# Patient Record
Sex: Male | Born: 1995 | Race: Black or African American | Hispanic: No | Smoking: Never smoker
Health system: Southern US, Community
[De-identification: ages and names within clinical notes are randomized; demographics above are authoritative.]

---
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
## 2023-06-08 ENCOUNTER — Encounter: Payer: Self-pay | Admitting: Family Medicine

## 2023-06-08 ENCOUNTER — Ambulatory Visit: Payer: Self-pay | Admitting: Family Medicine

## 2023-06-08 DIAGNOSIS — Z113 Encounter for screening for infections with a predominantly sexual mode of transmission: Secondary | ICD-10-CM

## 2023-06-08 LAB — HM HIV SCREENING LAB: HM HIV Screening: NEGATIVE

## 2023-06-08 NOTE — Progress Notes (Signed)
[facility] Department STI clinic/screening visit  Subjective:  Jason Andrews is a 27 y.o. male being seen today for an STI screening visit. The patient reports they do not have symptoms.    Patient has the following medical conditions:  There are no problems to display for this patient.    Chief Complaint  Patient presents with   SEXUALLY TRANSMITTED DISEASE    HPI  Patient reports to clinic for STI testing. No symptoms  Last HIV test per patient/review of record was No results found for: "HMHIVSCREEN" No results found for: "HIV"  Does the patient or their partner desires a pregnancy in the next year? No  Screening for MPX risk: Does the patient have an unexplained rash? No Is the patient MSM? No Does the patient endorse multiple sex partners or anonymous sex partners? No Did the patient have close or sexual contact with a person diagnosed with MPX? No Has the patient traveled outside the [place] where MPX is endemic? No Is there a high clinical suspicion for MPX-- evidenced by one of the following No  -Unlikely to be chickenpox  -Lymphadenopathy  -Rash that present in same phase of evolution on any given body part   See flowsheet for further details and programmatic requirements.    There is no immunization history on file for this patient.   The following portions of the patient's history were reviewed and updated as appropriate: allergies, current medications, past medical history, past social history, past surgical history and problem list.  Objective:  There were no vitals filed for this visit.  Physical Exam Vitals and nursing note reviewed.  Constitutional:      Appearance: Normal appearance.  HENT:     Head: Normocephalic and atraumatic.     Mouth/Throat:     Mouth: Mucous membranes are moist.     Pharynx: No oropharyngeal exudate or posterior oropharyngeal erythema.  Eyes:     General:        Right eye: No discharge.        Left eye: No  discharge.     Conjunctiva/sclera:     Right eye: Right conjunctiva is not injected. No exudate.    Left eye: Left conjunctiva is not injected. No exudate. Pulmonary:     Effort: Pulmonary effort is normal.  Abdominal:     General: Abdomen is flat.     Palpations: Abdomen is soft. There is no hepatomegaly or mass.     Tenderness: There is no abdominal tenderness. There is no rebound.  Genitourinary:    Comments: Declined genital exam- asymptomatic Lymphadenopathy:     Cervical: No cervical adenopathy.     Upper Body:     Right upper body: No supraclavicular or axillary adenopathy.     Left upper body: No supraclavicular or axillary adenopathy.  Skin:    General: Skin is warm and dry.  Neurological:     Mental Status: He is alert and oriented to person, place, and time.       Assessment and Plan:  Jason Andrews is a 27 y.o. male presenting to the [facility] Department for STI screening  1. Screening for venereal disease  - HIV [facility] - Syphilis Serology, [facility] - Chlamydia/GC NAA, Confirmation   Patient does not have STI symptoms Patient accepted all screenings including  urine GC/Chlamydia, and blood work for HIV/Syphilis. Patient meets criteria for HepB screening? No. Ordered? not applicable Patient meets criteria for HepC screening? No. Ordered? not  applicable Recommended condom use with all sex Discussed importance of condom use for STI prevent  Treat positive test results per standing order. Discussed time line for State Lab results and that patient will be called with positive results and encouraged patient to call if he had not heard in 2 weeks Recommended repeat testing in 3 months with positive results. Recommended returning for continued or worsening symptoms.   Return if symptoms worsen or fail to improve, for STI screening.  No future appointments. Total time spent 15 minutes Lenice Llamas, [place]

## 2023-06-10 LAB — CHLAMYDIA/GC NAA, CONFIRMATION
Chlamydia trachomatis, NAA: NEGATIVE
Neisseria gonorrhoeae, NAA: NEGATIVE

## 2024-04-25 ENCOUNTER — Encounter: Payer: Self-pay | Admitting: Nurse Practitioner

## 2024-04-25 ENCOUNTER — Ambulatory Visit: Payer: Self-pay | Admitting: Nurse Practitioner

## 2024-04-25 DIAGNOSIS — Z113 Encounter for screening for infections with a predominantly sexual mode of transmission: Secondary | ICD-10-CM

## 2024-04-25 LAB — HM HEPATITIS C SCREENING LAB: HM Hepatitis Screen: NEGATIVE

## 2024-04-25 LAB — HM HIV SCREENING LAB: HM HIV Screening: NEGATIVE

## 2024-04-25 NOTE — Progress Notes (Signed)
 [facility] Department STI clinic [address], [address] [place] [address] Main phone: [phone]  STI screening visit  Subjective:  Jason Andrews is a 28 y.o. male being seen today for an STI screening visit. The patient reports they do not have symptoms.    Patient has the following medical conditions:  There are no active problems to display for this patient.  Chief Complaint  Patient presents with   SEXUALLY TRANSMITTED DISEASE    HPI Patient is a pleasant 28 y.o. male who presents to the office today requesting asymptomatic STI testing.   He reports 1 male/male partner almost 3 months ago. He reports that he practices penile/vaginal penetrative sex. Patient reports using condoms sometimes. Patient indicates no STI history. He reports last sex was 01/31/2024.   See flowsheet for further details and programmatic requirements  Hyperlink available at the top of the signed note in blue.  Flow sheet content below:  Pregnancy Intention Screening Does the patient want to become pregnant in the next year?: No Does the patient's partner want to become pregnant in the next year?: No Would the patient like to discuss contraceptive options today?: N/A All Patients Anyone smoke around pt and/or pt's children?: No Anyone smoke inside pt's house?: No Anyone smoke inside car?: No Anyone smoke inside the workplace?: No Reason For STD Screen STD Screening: Is asymptomatic Have you ever had an STD?: No History of Antibiotic use in the past 2 weeks?: No STD Symptoms Denies all: Yes Risk Factors for Hep B Household, sexual, or needle sharing contact of a person infected with Hep B: No Sexual contact with a person who uses drugs not as prescribed?: No Currently or Ever used drugs not as prescribed: No HIV Positive: No PRep Patient: No Men who have sex with men: No Have Hepatitis C: No History of Incarceration: No History of Homeslessness?: No Anal sex  following anal drug use?: No Risk Factors for Hep C Currently using drugs not as prescribed: No Sexual partner(s) currently using drugs as not prescribed: No History of drug use: Yes HIV Positive: No People with a history of incarceration: No People born between the years of 78 and 78: No Hepatitis Counseling Hep C Counseling: Counseled patient about increased risk of Hep C and recommendation for testing, Patient accepts testing for Hep C today Abuse History Has patient ever been abused physically?: No Has patient ever been abused sexually?: No Does patient feel they have a problem with Anxiety?: No Does patient feel they have a problem with Depression?: No Referral to Behavioral Health: N/A Counseling Patient counseled to use condoms with all sex: Condoms declined RTC in 2-3 weeks for test results: Yes Clinic will call if test results abnormal before test result appt.: Yes Test results given to patient Patient counseled to use condoms with all sex: Condoms declined Contraception Wrap Up Current Method: Male Condom End Method: Male Condom Contraception Counseling Provided: Yes How was the end contraceptive method provided?: N/A (Declined condoms)  Screening for MPX risk: Does the patient have an unexplained rash? No Is the patient MSM? No Does the patient endorse multiple sex partners or anonymous sex partners? No Did the patient have close or sexual contact with a person diagnosed with MPX? No Has the patient traveled outside the [place]  where MPX is endemic? No Is there a high clinical suspicion for MPX-- evidenced by one of the following No  -Unlikely to be chickenpox  -Lymphadenopathy  -Rash that present in same phase  of evolution on any given body part  STI screening history: Last HIV test per patient/review of record was  Lab Results  Component Value Date   HMHIVSCREEN Negative - Validated 06/08/2023    Last HEPC test per patient/review of record was No results  found for: "HMHEPCSCREEN" No components found for: "HEPC"   Last HEPB test per patient/review of record was No components found for: "HMHEPBSCREEN"   Fertility: Does the patient or their partner desires a pregnancy in the next year? No   There is no immunization history on file for this patient.  The following portions of the patient's history were reviewed and updated as appropriate: allergies, current medications, past medical history, past social history, past surgical history and problem list.  Objective:  There were no vitals filed for this visit.  Physical Exam Nursing note reviewed.  Constitutional:      Appearance: Normal appearance.  HENT:     Head: Normocephalic.     Salivary Glands: Right salivary gland is not diffusely enlarged or tender. Left salivary gland is not diffusely enlarged or tender.     Mouth/Throat:     Lips: Pink. No lesions.     Mouth: Mucous membranes are moist. No oral lesions.     Tongue: No lesions. Tongue does not deviate from midline.     Pharynx: Oropharynx is clear. Uvula midline. No oropharyngeal exudate or posterior oropharyngeal erythema.     Tonsils: No tonsillar exudate.  Eyes:     General:        Right eye: No discharge.        Left eye: No discharge.     Conjunctiva/sclera:     Right eye: Right conjunctiva is not injected. No exudate.    Left eye: Left conjunctiva is not injected. No exudate. Pulmonary:     Effort: Pulmonary effort is normal.  Genitourinary:    Comments: Patient asymptomatic and declines genital exam.  Lymphadenopathy:     Head:     Right side of head: No submental, submandibular, tonsillar, preauricular or posterior auricular adenopathy.     Left side of head: No submental, submandibular, tonsillar, preauricular or posterior auricular adenopathy.     Cervical: No cervical adenopathy.     Right cervical: No superficial or posterior cervical adenopathy.    Left cervical: No superficial or posterior cervical  adenopathy.     Upper Body:     Right upper body: No supraclavicular or axillary adenopathy.     Left upper body: No supraclavicular or axillary adenopathy.  Skin:    General: Skin is warm and dry.     Findings: No lesion or rash.     Comments: Skin tone appropriate for ethnicity. Assessed exposed areas and back only.   Neurological:     Mental Status: He is alert and oriented to person, place, and time.  Psychiatric:        Attention and Perception: Attention and perception normal.        Mood and Affect: Mood and affect normal.        Speech: Speech normal.        Behavior: Behavior normal. Behavior is cooperative.        Thought Content: Thought content normal.      Assessment and Plan:  Jason Andrews is a 28 y.o. male presenting to the [facility] Department for STI screening  1. Screening for venereal disease (Primary)  - Chlamydia/GC NAA, Confirmation - Syphilis Serology,  Lab - HIV/HCV  [facility] - HBV Antigen/Antibody [facility]   Patient does not have STI symptoms Patient accepted the following screenings: HIV, RPR, Hep B, and Hep C and urine GC/Chlamydia. Patient meets criteria for HepB screening? Yes. Ordered? yes Patient meets criteria for HepC screening? Yes. Ordered? yes Recommended condom use with all sex Discussed importance of condom use for STI prevention  Treat positive test results per standing order. Discussed time line for State Lab results and that patient will be called with positive results and encouraged patient to call if he had not heard in 2 weeks Recommended repeat testing in 3 months with positive results. Recommended returning for continued or worsening symptoms.   Return if symptoms worsen or fail to improve.  No future appointments.  Total time with patient 20 minutes.   Merleen Stare, NP

## 2024-04-27 LAB — HBV ANTIGEN/ANTIBODY STATE LAB
Hep B Core Total Ab: NONREACTIVE
Hep B S Ab: NONREACTIVE
Hepatitis B Surface Antigen: NONREACTIVE

## 2024-04-27 LAB — CHLAMYDIA/GC NAA, CONFIRMATION
Chlamydia trachomatis, NAA: NEGATIVE
Neisseria gonorrhoeae, NAA: NEGATIVE

## 2024-05-05 ENCOUNTER — Encounter: Payer: Self-pay | Admitting: Nurse Practitioner
